# Patient Record
Sex: Female | Born: 2000 | Hispanic: Yes | Marital: Single | Smoking: Never smoker
Health system: Southern US, Community
[De-identification: ages and names within clinical notes are randomized; demographics above are authoritative.]

## PROBLEM LIST (undated history)

## (undated) DIAGNOSIS — J45909 Unspecified asthma, uncomplicated: Secondary | ICD-10-CM

## (undated) DIAGNOSIS — D649 Anemia, unspecified: Secondary | ICD-10-CM

---
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
## 2022-08-09 ENCOUNTER — Other Ambulatory Visit: Payer: Self-pay

## 2022-08-09 ENCOUNTER — Emergency Department: Payer: Medicaid Other

## 2022-08-09 DIAGNOSIS — R0789 Other chest pain: Secondary | ICD-10-CM | POA: Diagnosis not present

## 2022-08-09 DIAGNOSIS — R040 Epistaxis: Secondary | ICD-10-CM | POA: Insufficient documentation

## 2022-08-09 DIAGNOSIS — R519 Headache, unspecified: Secondary | ICD-10-CM | POA: Diagnosis not present

## 2022-08-09 LAB — BASIC METABOLIC PANEL
Anion gap: 8 (ref 5–15)
BUN: 13 mg/dL (ref 6–20)
CO2: 25 mmol/L (ref 22–32)
Calcium: 9.1 mg/dL (ref 8.9–10.3)
Chloride: 105 mmol/L (ref 98–111)
Creatinine, Ser: 0.68 mg/dL (ref 0.44–1.00)
GFR, Estimated: >60 mL/min (ref >60)
Glucose, Bld: 90 mg/dL (ref 70–99)
Potassium: 3.7 mmol/L (ref 3.5–5.1)
Sodium: 138 mmol/L (ref 135–145)

## 2022-08-09 LAB — CBC
HCT: 40.3 % (ref 36.0–46.0)
Hemoglobin: 13 g/dL (ref 12.0–15.0)
MCH: 28.5 pg (ref 26.0–34.0)
MCHC: 32.3 g/dL (ref 30.0–36.0)
MCV: 88.4 fL (ref 80.0–100.0)
Platelets: 248 10*3/uL (ref 150–400)
RBC: 4.56 MIL/uL (ref 3.87–5.11)
RDW: 13.7 % (ref 11.5–15.5)
WBC: 5.8 10*3/uL (ref 4.0–10.5)
nRBC: 0 % (ref 0.0–0.2)

## 2022-08-09 LAB — TROPONIN I (HIGH SENSITIVITY): Troponin I (High Sensitivity): <2 ng/L (ref <18)

## 2022-08-09 LAB — POC URINE PREG, ED: Preg Test, Ur: NEGATIVE

## 2022-08-09 NOTE — ED Triage Notes (Signed)
Pt presents to ER with c/o runny nose x4 days, and a nose bleed today that lasted appx 10 minutes.  Pt also states she has been having a HA, blurry vision that started 7/19.  Pt also endorses some central chest pain that occasionally radiates to left shoulder, and is intermittent in nature.  Denies previous issues with nose bleeds, HA or blurry vision.  Pt states she does not wear glasses/contacts at home.  Pt otherwise A&O x4 and in NAD.

## 2022-08-10 ENCOUNTER — Emergency Department
Admission: EM | Admit: 2022-08-10 | Discharge: 2022-08-10 | Disposition: A | Discharge status: Home / Self Care | Payer: Medicaid Other | Attending: Emergency Medicine | Admitting: Emergency Medicine

## 2022-08-10 DIAGNOSIS — R040 Epistaxis: Secondary | ICD-10-CM

## 2022-08-10 DIAGNOSIS — R0789 Other chest pain: Secondary | ICD-10-CM

## 2022-08-10 NOTE — Discharge Instructions (Addendum)
Please take Tylenol and ibuprofen/Advil for your pain.  It is safe to take them together, or to alternate them every few hours.  Take up to 1000mg of Tylenol at a time, up to 4 times per day.  Do not take more than 4000 mg of Tylenol in 24 hours.  For ibuprofen, take 400-600 mg, 3 - 4 times per day.  

## 2022-08-10 NOTE — ED Provider Notes (Signed)
[facility] Provider Note    Event Date/Time   First MD Initiated Contact with Patient 08/10/22 0033     (approximate)   History   Chest Pain and Headache   HPI  Taylor Beltran is a 22 y.o. female who presents to the ED for evaluation of Chest Pain and Headache   Review of PCP visit from 3/14.  Seen for cystitis, chronic low back pain  Patient presents to the ED alongside her sister for evaluation of 3-4 nosebleeds in the past few days.  She presents from work and reports that she wanted to get checked out because she had another nosebleed while at work this evening.  Reports bleeding for a few minutes from her left nare that she was able to stop and has not recurred.  Denies any trauma or nasal/facial injuries.  No continued bleeding, syncope or other bleeding diatheses.  Secondary to this, also reports chronic intermittent left-sided chest discomfort.  No chest pain right now.   Physical Exam   Triage Vital Signs: ED Triage Vitals  Encounter Vitals Group     BP 08/09/22 2251 (!) 122/56     Systolic BP Percentile --      Diastolic BP Percentile --      Pulse Rate 08/09/22 2251 75     Resp 08/09/22 2251 18     Temp 08/09/22 2251 98.2 F (36.8 C)     Temp Source 08/09/22 2251 Oral     SpO2 08/09/22 2251 98 %     Weight 08/09/22 2252 134 lb (60.8 kg)     Height 08/09/22 2252 4\' 11"  (1.499 m)     Head Circumference --      Peak Flow --      Pain Score 08/09/22 2251 7     Pain Loc --      Pain Education --      Exclude from Growth Chart --     Most recent vital signs: Vitals:   08/10/22 0115 08/10/22 0126  BP:  100/64  Pulse: (!) 57   Resp: 15   Temp:    SpO2: 100%     General: Awake, no distress.  CV:  Good peripheral perfusion. RRR without appreciable murmur Resp:  Normal effort.  Abd:  No distention.  MSK:  No deformity noted.  Neuro:  No focal deficits appreciated. Cranial nerves II through XII intact 5/5 strength and  sensation in all 4 extremities Other:  Tiny speck of dried blood to the anterior left nare.  No nasal septal hematoma or signs of active bleeding Normal posterior oropharynx without evidence of posterior bleeding   ED Results / Procedures / Treatments   Labs (all labs ordered are listed, but only abnormal results are displayed) Labs Reviewed  CBC  BASIC METABOLIC PANEL  POC URINE PREG, ED  TROPONIN I (HIGH SENSITIVITY)  TROPONIN I (HIGH SENSITIVITY)    EKG Sinus rhythm with a rate of 62 bpm.  Rightward axis and normal intervals.  No evidence of acute ischemia.  No comparison.  RADIOLOGY CXR interpreted by me without evidence of acute cardiopulmonary pathology.  Official radiology report(s): DG Chest 1 View  Result Date: 08/09/2022 CLINICAL DATA:  Chest pain EXAM: CHEST  1 VIEW COMPARISON:  None Available. FINDINGS: The heart size and mediastinal contours are within normal limits. Both lungs are clear. The visualized skeletal structures are unremarkable. IMPRESSION: No active disease. Electronically Signed   By: Miles Costain.O.  On: 08/09/2022 23:15    PROCEDURES and INTERVENTIONS:  .1-3 Lead EKG Interpretation  Performed by: Delton Prairie, MD Authorized by: Delton Prairie, MD     Interpretation: normal     ECG rate:  70   ECG rate assessment: normal     Rhythm: sinus rhythm     Ectopy: none     Conduction: normal     Medications - No data to display   IMPRESSION / MDM / ASSESSMENT AND PLAN / ED COURSE  I reviewed the triage vital signs and the nursing notes.  Differential diagnosis includes, but is not limited to, ACS, PTX, PNA, muscle strain/spasm, PE, dissection, anxiety, pleural effusion  {Patient presents with symptoms of an acute illness or injury that is potentially life-threatening.  Patient presents with chronic intermittent chest discomfort and a couple of recent nosebleeds, without evidence of acute pathology and suitable for outpatient management.  She  is here primarily because she has had a couple nosebleeds in the past few days.  Has a normal exam without any active epistaxis or signs of nasal septal hematoma or trauma or any anatomic derangement. Benign workup with a nonischemic EKG, negative troponin, normal CXR, CBC and metabolic panel.  Discussed nosebleeds, low risk nonspecific chest pain and return precautions.  Advised PCP follow-up.  PERC negative      FINAL CLINICAL IMPRESSION(S) / ED DIAGNOSES   Final diagnoses:  Other chest pain  Epistaxis     Rx / DC Orders   ED Discharge Orders     None        Note:  This document was prepared using Dragon voice recognition software and may include unintentional dictation errors.   Delton Prairie, MD 08/10/22 [phone]
